# Patient Record
Sex: Female | Born: 1995 | Race: White | Hispanic: No | Marital: Single | Smoking: Never smoker
Health system: Southern US, Community
[De-identification: ages and names within clinical notes are randomized; demographics above are authoritative.]

---
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
## 2015-06-19 ENCOUNTER — Encounter: Payer: Self-pay | Admitting: Emergency Medicine

## 2015-06-19 ENCOUNTER — Emergency Department: Payer: Medicaid Other

## 2015-06-19 ENCOUNTER — Emergency Department
Admission: EM | Admit: 2015-06-19 | Discharge: 2015-06-19 | Disposition: A | Discharge status: Home / Self Care | Payer: Medicaid Other | Attending: Emergency Medicine | Admitting: Emergency Medicine

## 2015-06-19 DIAGNOSIS — R1013 Epigastric pain: Secondary | ICD-10-CM | POA: Diagnosis not present

## 2015-06-19 DIAGNOSIS — R1010 Upper abdominal pain, unspecified: Secondary | ICD-10-CM | POA: Diagnosis present

## 2015-06-19 DIAGNOSIS — R110 Nausea: Secondary | ICD-10-CM | POA: Diagnosis not present

## 2015-06-19 DIAGNOSIS — K8020 Calculus of gallbladder without cholecystitis without obstruction: Secondary | ICD-10-CM | POA: Insufficient documentation

## 2015-06-19 DIAGNOSIS — Z3202 Encounter for pregnancy test, result negative: Secondary | ICD-10-CM | POA: Insufficient documentation

## 2015-06-19 DIAGNOSIS — R109 Unspecified abdominal pain: Secondary | ICD-10-CM | POA: Insufficient documentation

## 2015-06-19 LAB — CBC WITH DIFFERENTIAL/PLATELET
BASOS ABS: 0.2 10*3/uL — AB (ref 0–0.1)
Basophils Relative: 2 %
EOS PCT: 0 %
Eosinophils Absolute: 0 10*3/uL (ref 0–0.7)
HEMATOCRIT: 37.6 % (ref 35.0–47.0)
Hemoglobin: 12.9 g/dL (ref 12.0–16.0)
LYMPHS ABS: 1.1 10*3/uL (ref 1.0–3.6)
LYMPHS PCT: 8 %
MCH: 28.2 pg (ref 26.0–34.0)
MCHC: 34.2 g/dL (ref 32.0–36.0)
MCV: 82.6 fL (ref 80.0–100.0)
MONO ABS: 0.6 10*3/uL (ref 0.2–0.9)
MONOS PCT: 5 %
NEUTROS ABS: 11.1 10*3/uL — AB (ref 1.4–6.5)
Neutrophils Relative %: 85 %
PLATELETS: 179 10*3/uL (ref 150–440)
RBC: 4.56 MIL/uL (ref 3.80–5.20)
RDW: 13.4 % (ref 11.5–14.5)
WBC: 13 10*3/uL — ABNORMAL HIGH (ref 3.6–11.0)

## 2015-06-19 LAB — URINALYSIS COMPLETE WITH MICROSCOPIC (ARMC ONLY)
BILIRUBIN URINE: NEGATIVE
Glucose, UA: NEGATIVE mg/dL
Hgb urine dipstick: NEGATIVE
KETONES UR: NEGATIVE mg/dL
LEUKOCYTES UA: NEGATIVE
NITRITE: NEGATIVE
PROTEIN: 30 mg/dL — AB
SPECIFIC GRAVITY, URINE: 1.029 (ref 1.005–1.030)
pH: 7 (ref 5.0–8.0)

## 2015-06-19 LAB — POCT PREGNANCY, URINE: Preg Test, Ur: NEGATIVE

## 2015-06-19 LAB — COMPREHENSIVE METABOLIC PANEL
ALT: 66 U/L — ABNORMAL HIGH (ref 14–54)
ANION GAP: 6 (ref 5–15)
AST: 101 U/L — ABNORMAL HIGH (ref 15–41)
Albumin: 4.7 g/dL (ref 3.5–5.0)
Alkaline Phosphatase: 77 U/L (ref 38–126)
BUN: 17 mg/dL (ref 6–20)
CALCIUM: 9.3 mg/dL (ref 8.9–10.3)
CHLORIDE: 106 mmol/L (ref 101–111)
CO2: 28 mmol/L (ref 22–32)
Creatinine, Ser: 0.84 mg/dL (ref 0.44–1.00)
GFR calc non Af Amer: >60 mL/min (ref >60)
Glucose, Bld: 136 mg/dL — ABNORMAL HIGH (ref 65–99)
Potassium: 3.8 mmol/L (ref 3.5–5.1)
SODIUM: 140 mmol/L (ref 135–145)
TOTAL PROTEIN: 7.7 g/dL (ref 6.5–8.1)
Total Bilirubin: 0.9 mg/dL (ref 0.3–1.2)

## 2015-06-19 LAB — CBC
HEMATOCRIT: 43.4 % (ref 35.0–47.0)
HEMOGLOBIN: 14.5 g/dL (ref 12.0–16.0)
MCH: 27.7 pg (ref 26.0–34.0)
MCHC: 33.4 g/dL (ref 32.0–36.0)
MCV: 83 fL (ref 80.0–100.0)
Platelets: 193 10*3/uL (ref 150–440)
RBC: 5.23 MIL/uL — ABNORMAL HIGH (ref 3.80–5.20)
RDW: 13 % (ref 11.5–14.5)
WBC: 14.2 10*3/uL — AB (ref 3.6–11.0)

## 2015-06-19 LAB — LIPASE, BLOOD: LIPASE: 34 U/L (ref 11–51)

## 2015-06-19 MED ORDER — SODIUM CHLORIDE 0.9 % IV BOLUS (SEPSIS)
1000.0000 mL | Freq: Once | INTRAVENOUS | Status: AC
Start: 2015-06-19 — End: 2015-06-19
  Administered 2015-06-19: 1000 mL via INTRAVENOUS

## 2015-06-19 MED ORDER — ONDANSETRON 4 MG PO TBDP: 4.0000 mg | ORAL_TABLET | Freq: Four times a day (QID) | ORAL | Status: AC | PRN

## 2015-06-19 MED ORDER — MORPHINE SULFATE (PF) 4 MG/ML IV SOLN
4.0000 mg | Freq: Once | INTRAVENOUS | Status: AC
  Administered 2015-06-19: 4 mg via INTRAVENOUS
  Filled 2015-06-19: qty 1

## 2015-06-19 MED ORDER — OXYCODONE-ACETAMINOPHEN 5-325 MG PO TABS: 1.0000 | ORAL_TABLET | Freq: Four times a day (QID) | ORAL | Status: AC | PRN

## 2015-06-19 MED ORDER — ONDANSETRON HCL 4 MG/2ML IJ SOLN
4.0000 mg | Freq: Once | INTRAMUSCULAR | Status: AC
  Administered 2015-06-19: 4 mg via INTRAVENOUS
  Filled 2015-06-19: qty 2

## 2015-06-19 NOTE — ED Notes (Signed)
Patient ambulatory to triage with steady gait, without difficulty or distress noted; pt reports mid abd pain accomp by nausea x 3 years, worse tonight

## 2015-06-19 NOTE — Consult Note (Signed)
CC: Epigastric pain, now resolved  HPI: Patricia Schultz is a pleasant 19 yo F with a history of gallstones who presents with epigastric pain approx 7 hours ago.  + nausea.  Similar episodes, she reports up to 30, which she has had investigated numerous times at [facility] and which she had been told were due to gallstones.  Usually occur after meals but no specific food.  Currently with minimal pain.  Was about to have surgery once but did not for unknown reasons.  No fevers/chills, night sweats, shortness of breath, cough, chest pain, current abdominal pain, nausea/vomiting, diarrhea/constipation, dysuria/hematuria.  Active Ambulatory Problems    Diagnosis Date Noted  . Abdominal pain   . Gallstones    Resolved Ambulatory Problems    Diagnosis Date Noted  . No Resolved Ambulatory Problems   No Additional Past Medical History   History reviewed. No pertinent past surgical history.     Medication List    TAKE these medications        ondansetron 4 MG disintegrating tablet  Commonly known as:  ZOFRAN ODT  Take 1 tablet (4 mg total) by mouth every 6 (six) hours as needed for nausea or vomiting.     oxyCODONE-acetaminophen 5-325 MG tablet  Commonly known as:  ROXICET  Take 1 tablet by mouth every 6 (six) hours as needed for severe pain.      No Known Allergies   Social History   Social History  . Marital Status: Single    Spouse Name: N/A  . Number of Children: N/A  . Years of Education: N/A   Occupational History  . Not on file.   Social History Main Topics  . Smoking status: Never Smoker   . Smokeless tobacco: Not on file  . Alcohol Use: No  . Drug Use: Not on file  . Sexual Activity: Not on file   Other Topics Concern  . Not on file   Social History Narrative  . No narrative on file   No family history on file.   ROS: Full ROS obtained, pertinent positives and negatives as above  Blood pressure 121/65, pulse 98, temperature 97.4 F (36.3 C), temperature  source Oral, resp. rate 16, height 5\' 3"  (1.6 m), weight 180 lb (81.647 kg), last menstrual period 06/10/2015, SpO2 98 %. GEN: NAD/A&Ox3 FACE: no obvious facial trauma, normal external nose, normal external ears EYES: no scleral icterus, no conjunctivitis HEAD: normocephalic atraumatic CV: RRR, no MRG RESP: moving air well, lungs clear ABD: soft, nontender, nondistended EXT: moving all ext well, strength 5/5 NEURO: cnII-XII grossly intact, sensation intact all 4 ext  Labs: reviewed, significant for  WBC 13.0 AST 101 ALT 66 Lipase 34 Bili 0.9  Imaging: Personally reviewed, significant for gallstones  A/P 19 yo with recurrent epigastric pain and gallstones.  Suspect biliary colic.  No pain to palpation, WBC elevated but afebrile.  No indication for admission or emergent surgery.  Will have patient follow up in office for consideration of outpatient cholecystectomy.

## 2015-06-19 NOTE — Discharge Instructions (Signed)
You were seen in the emergency room for abdominal pain. It is important that you follow up closely with [facility] in the next couple of days.  Please return to the emergency room right away if you are to develop a fever, severe nausea, your pain becomes severe or worsens, you are unable to keep food down, begin vomiting any dark or bloody fluid, you develop any dark or bloody stools, feel dehydrated, or other new concerns or symptoms arise.  No driving tonight or while taking oxycodone.

## 2015-06-19 NOTE — ED Provider Notes (Signed)
[facility] Emergency Department Provider Note REMINDER - THIS NOTE IS NOT A FINAL MEDICAL RECORD UNTIL IT IS SIGNED. UNTIL THEN, THE CONTENT BELOW MAY REFLECT INFORMATION FROM A DOCUMENTATION TEMPLATE, NOT THE ACTUAL PATIENT VISIT. ____________________________________________  Time seen: Approximately 2:33 AM  I have reviewed the triage vital signs and the nursing notes.   HISTORY  Chief Complaint Abdominal Pain    HPI Patricia Schultz is a 19 y.o. female who reports a previous history of wound normal vaginal delivery 2 years ago, also a history of gallstones without previous cholecystectomy.  Patient reports that about 9 PM she developed moderate upper abdominal discomfort, pointing to her epigastrium. She reports that she is nauseated, but has not vomited. She denies fevers or chills. She reports that she's had similar discomfort off and on for about 3 years, she's had previous CAT scans at [facility] and has been told that she has gallstones causing her pain however has never had surgery to remove them.  She denies pregnancy today, she has an Implanon. No pelvic pain, no lower abdominal pain. Pain does occasionally shoot from the upper abdomen into the mid back.  No chest pain or trouble breathing.  Moderate, achy and crampy pain. Fairly constant since 9 PM. History reviewed. No pertinent past medical history.  There are no active problems to display for this patient.   History reviewed. No pertinent past surgical history.  Current Outpatient Rx  Name  Route  Sig  Dispense  Refill  . ondansetron (ZOFRAN ODT) 4 MG disintegrating tablet   Oral   Take 1 tablet (4 mg total) by mouth every 6 (six) hours as needed for nausea or vomiting.   20 tablet   0   . oxyCODONE-acetaminophen (ROXICET) 5-325 MG tablet   Oral   Take 1 tablet by mouth every 6 (six) hours as needed for severe pain.   6 tablet   0     Allergies Review of patient's  allergies indicates no known allergies.  No family history on file.  Social History Social History  Substance Use Topics  . Smoking status: Never Smoker   . Smokeless tobacco: None  . Alcohol Use: No    Review of Systems Constitutional: No fever/chills Eyes: No visual changes. ENT: No sore throat. Cardiovascular: Denies chest pain. Respiratory: Denies shortness of breath. Gastrointestinal: no vomiting.  No diarrhea.  No constipation. Genitourinary: Negative for dysuria. Musculoskeletal: Negative for back pain. No vaginal discomfort. No pelvic pain. Denies pregnancy. Skin: Negative for rash. Neurological: Negative for headaches, focal weakness or numbness.  10-point ROS otherwise negative.  ____________________________________________   PHYSICAL EXAM:  VITAL SIGNS: ED Triage Vitals  Enc Vitals Group     BP 06/19/15 0109 126/82 mmHg     Pulse Rate 06/19/15 0109 94     Resp 06/19/15 0109 20     Temp 06/19/15 0109 97.4 F (36.3 C)     Temp Source 06/19/15 0109 Oral     SpO2 06/19/15 0109 98 %     Weight 06/19/15 0109 180 lb (81.647 kg)     Height 06/19/15 0109 5\' 3"  (1.6 m)     Head Cir --      Peak Flow --      Pain Score 06/19/15 0124 6     Pain Loc --      Pain Edu? --      Excl. in GC? --    Constitutional: Alert and oriented. Well appearing and in  no acute distress. Eyes: Conjunctivae are normal. PERRL. EOMI. Head: Atraumatic. Nose: No congestion/rhinnorhea. Mouth/Throat: Mucous membranes are moist.  Oropharynx non-erythematous. Neck: No stridor.   Cardiovascular: Normal rate, regular rhythm. Grossly normal heart sounds.  Good peripheral circulation. Respiratory: Normal respiratory effort.  No retractions. Lungs CTAB. Gastrointestinal: Soft and nontender except for mild to moderate tenderness in the right upper quadrant and epigastrium without rebound or guarding. No distention. No abdominal bruits. No CVA tenderness. No tenderness at McBurney's point.  Negative psoas. No tenderness across the suprapubic region or pelvis. Musculoskeletal: No lower extremity tenderness nor edema.  No joint effusions. Neurologic:  Normal speech and language. No gross focal neurologic deficits are appreciated. Skin:  Skin is warm, dry and intact. No rash noted. Psychiatric: Mood and affect are normal. Speech and behavior are normal.  ____________________________________________   LABS (all labs ordered are listed, but only abnormal results are displayed)  Labs Reviewed  COMPREHENSIVE METABOLIC PANEL - Abnormal; Notable for the following:    Glucose, Bld 136 (*)    AST 101 (*)    ALT 66 (*)    All other components within normal limits  CBC - Abnormal; Notable for the following:    WBC 14.2 (*)    RBC 5.23 (*)    All other components within normal limits  URINALYSIS COMPLETEWITH MICROSCOPIC ([facility] ONLY) - Abnormal; Notable for the following:    Color, Urine YELLOW (*)    APPearance HAZY (*)    Protein, ur 30 (*)    Bacteria, UA RARE (*)    Squamous Epithelial / LPF 6-30 (*)    All other components within normal limits  CBC WITH DIFFERENTIAL/PLATELET - Abnormal; Notable for the following:    WBC 13.0 (*)    Neutro Abs 11.1 (*)    Basophils Absolute 0.2 (*)    All other components within normal limits  LIPASE, BLOOD  POCT PREGNANCY, URINE  POC URINE PREG, ED   ____________________________________________  EKG   ____________________________________________  RADIOLOGY  US Abdomen Limited RUQ (Final result) Result time: 06/19/15 03:32:46   Final result by Rad Results In Interface (06/19/15 03:32:46)   Narrative:   CLINICAL DATA: Abdominal pain. History of gallstones.  EXAM: US ABDOMEN LIMITED - RIGHT UPPER QUADRANT  COMPARISON: 04/12/2015  FINDINGS: Gallbladder:  Multiple gallstones demonstrated in the gallbladder appear mobile. No gallbladder wall thickening. Murphy's sign is negative.  Common bile duct:  Diameter: 3.9 mm,  normal. Pancreatic head region is obscured by bowel gas today and is not seen.  Liver:  No focal lesion identified. Within normal limits in parenchymal echogenicity.  IMPRESSION: Cholelithiasis with multiple stones in the gallbladder. Murphy's sign is negative.     I discussed with the patient the risks and benefits of abdominal CT scan. The present time there is no clear indication that the patient requires CT, the patient does have an abdominal complaint but exam does not suggest acute surgical abdomen and my suspicion for intra-abdominal infection including appendicitis, cholecystitis, aaa, dissection, ischemia, perforation, pancreatitis, diverticulitis or other acute major intra-abdominal process is quite low. After discussing the risks and benefits including benefits of additional evaluation for diagnoses, ruling out infection/perforation/aaa/etc, but also discussing the risks including low, "well less than 1%," but not 0 risk of inducing cancers due to radiation and potential risks of contrast the patient indicated via our shared medical decision-making that she would not do a CAT scan. Rather if the patient does have worsening symptoms, develops a high fever, develops pain  or persistent discomfort in the right upper quadrant or right lower quadrant, or other new concerns arise they will come back to emergency room right away. As the patient's clinician I think this is a very reasonable decision having discussed general risks and benefits of CT, and my clinical suspicion that CT would be of benefit at this time is very low.  ____________________________________________   PROCEDURES  Procedure(s) performed: None  Critical Care performed: No  ____________________________________________   INITIAL IMPRESSION / ASSESSMENT AND PLAN / ED COURSE  Pertinent labs & imaging results that were available during my care of the patient were reviewed by me and considered in my medical decision  making (see chart for details).  Patient presents with recurrent episode of reportedly chronic and episodic upper abdominal pain. Potentially diagnosed as gallstone related in the past, versus other etiology via [facility]  She is awake and alert and no distress, but she does have focal tenderness in epigastrium right upper quadrant. No signs or symptoms to suggest appendicitis by exam or clinical history. No pelvic pain, no lower abdominal pain. No associated vaginal discharge or symptoms.  At this point, given the patient's history and location of pain we'll proceed with right upper quadrant ultrasound to further evaluate for possible biliary process, or other acute right upper quadrant etiology. We'll reevaluate after ultrasound.  ----------------------------------------- 3:47 AM on 06/19/2015 -----------------------------------------  Patient reports her pain is only mild, but slightly persistent at this time. Continues to have mild epigastric pain on reexam. No rebound or guarding. Overall appears improved, fully alert in no distress. She does report improvement in pain. Based on her leukocytosis, presentation with right upper quadrant discomfort, and gallstones noted ultrasound I have placed a consult with Dr. Juliann Pulse of surgery who will see the patient in the ER for further advice and evaluation.  ----------------------------------------- 4:31 AM on 06/19/2015 -----------------------------------------  Patient reports pain free and all symptoms resolved. Has been seen by Dr. Juliann Pulse who advises outpatient evaluation with general surgery. We'll discharge the patient home, discussed careful use of oxycodone and careful abdominal pain return precautions with the patient who is agreeable. Friend will be driving her home.  I will prescribe the patient a narcotic pain medicine due to their condition which I anticipate will cause at least moderate pain short term. I discussed with  the patient safe use of narcotic pain medicines, and that they are not to drive, work in dangerous areas, or ever take more than prescribed (no more than 1 pill every 6 hours). We discussed that this is the type of medication that "Criss Alvine" may have overdosed on and the risks of this type of medicine. Patient is very agreeable to only use as prescribed and to never use more than prescribed.  ____________________________________________   FINAL CLINICAL IMPRESSION(S) / ED DIAGNOSES  Final diagnoses:  Abdominal pain   biliary colic    Sharyn Creamer, MD 06/19/15 [phone]

## 2016-03-18 DIAGNOSIS — K8051 Calculus of bile duct without cholangitis or cholecystitis with obstruction: Secondary | ICD-10-CM | POA: Diagnosis not present

## 2016-03-18 DIAGNOSIS — K8050 Calculus of bile duct without cholangitis or cholecystitis without obstruction: Secondary | ICD-10-CM

## 2016-03-19 DIAGNOSIS — K8051 Calculus of bile duct without cholangitis or cholecystitis with obstruction: Secondary | ICD-10-CM | POA: Diagnosis not present

## 2016-03-19 DIAGNOSIS — K8050 Calculus of bile duct without cholangitis or cholecystitis without obstruction: Secondary | ICD-10-CM | POA: Diagnosis not present

## 2016-03-20 DIAGNOSIS — K8051 Calculus of bile duct without cholangitis or cholecystitis with obstruction: Secondary | ICD-10-CM | POA: Diagnosis not present

## 2016-03-20 DIAGNOSIS — K8050 Calculus of bile duct without cholangitis or cholecystitis without obstruction: Secondary | ICD-10-CM | POA: Diagnosis not present

## 2017-04-24 IMAGING — US US ABDOMEN LIMITED
1 series · 14 of 25 positions shown · non-contrast
Comparison: 04/12/2015

CLINICAL DATA: Abdominal pain.  History of gallstones.

EXAM:
US ABDOMEN LIMITED - RIGHT UPPER QUADRANT

[Series 1: us abdomen limited · 0.17mm/px · 14 of 45 slices shown]
[im 1/45]
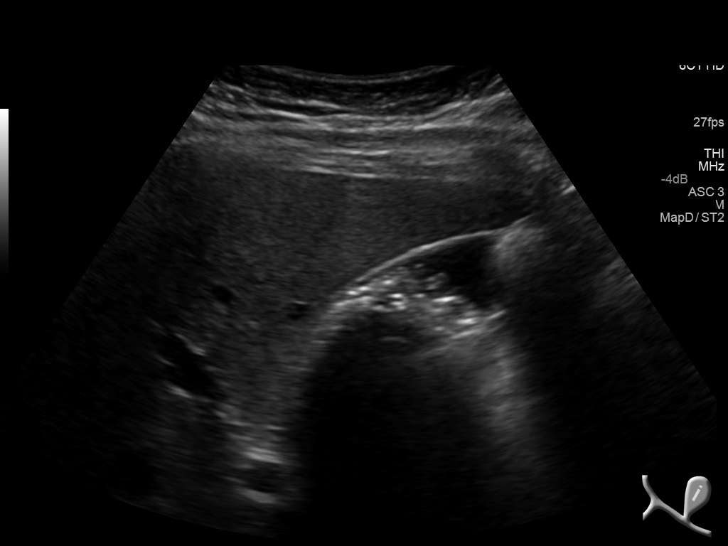
[im 4/45]
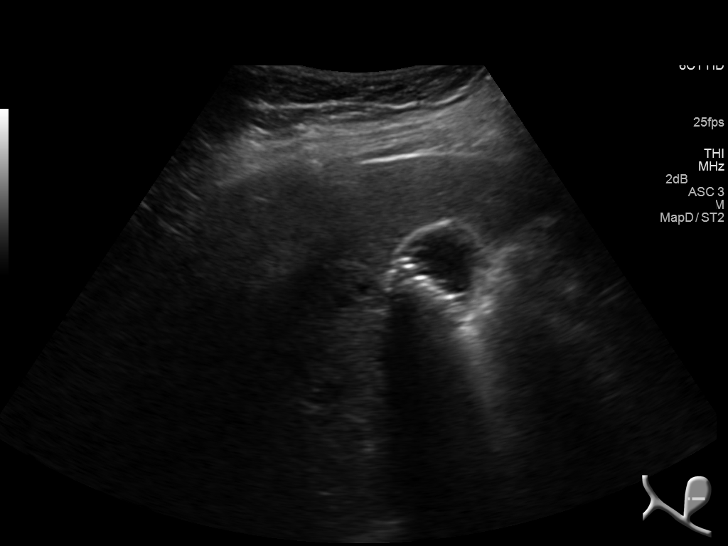
[im 8/45]
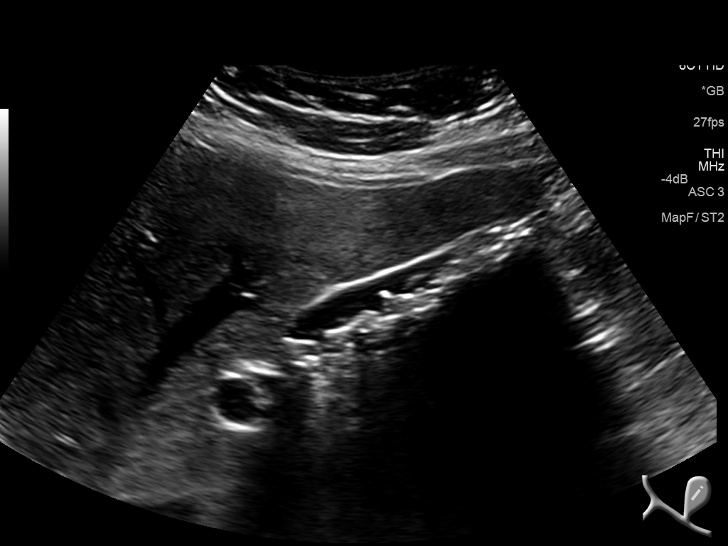
[im 12/45]
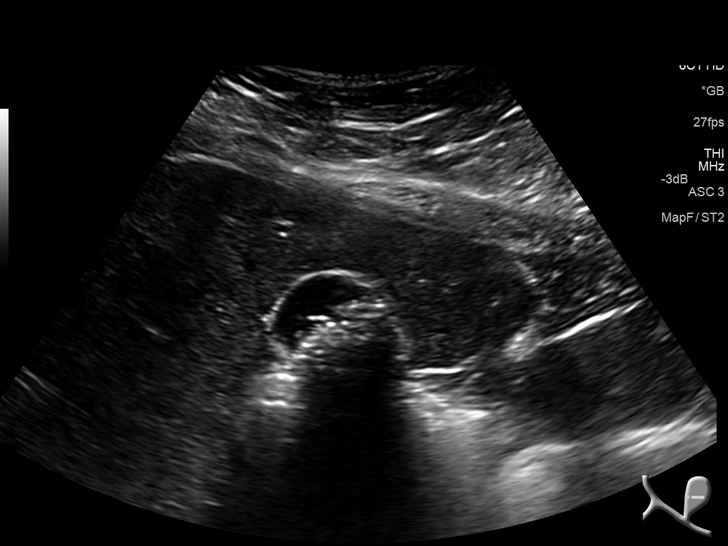
[im 15/45]
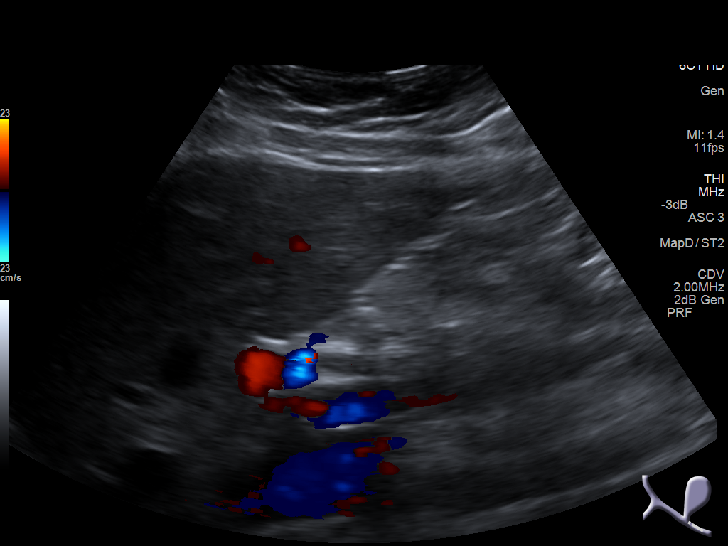
[im 17/45]
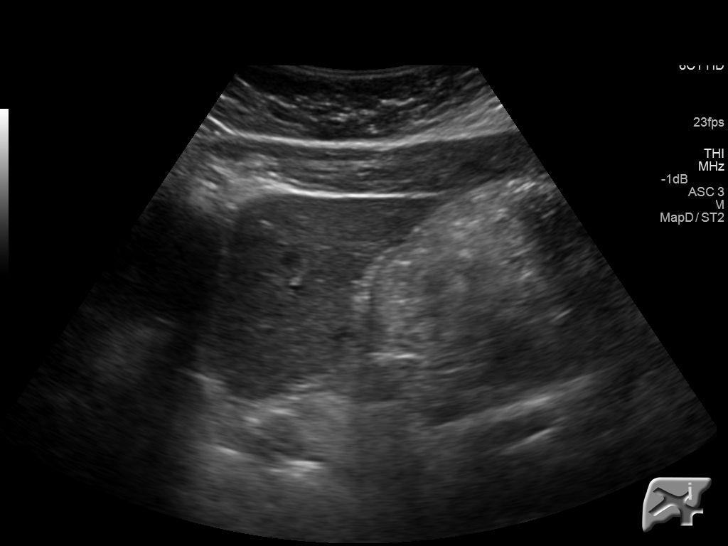
[im 21/45]
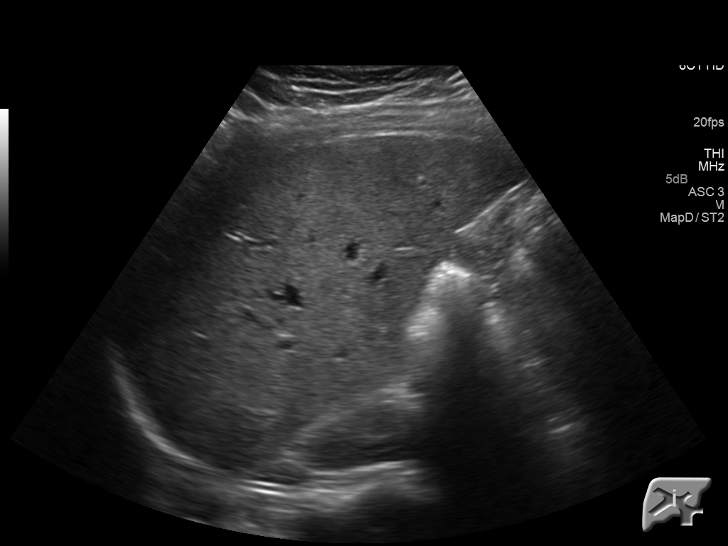
[im 24/45]
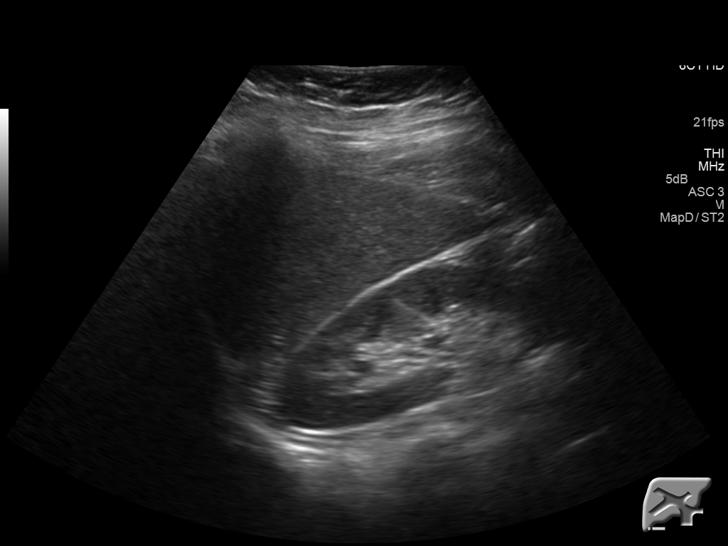
[im 28/45]
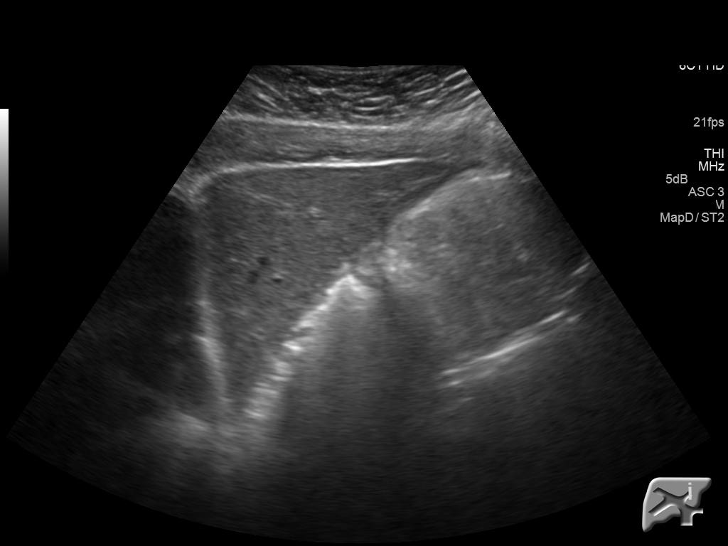
[im 30/45]
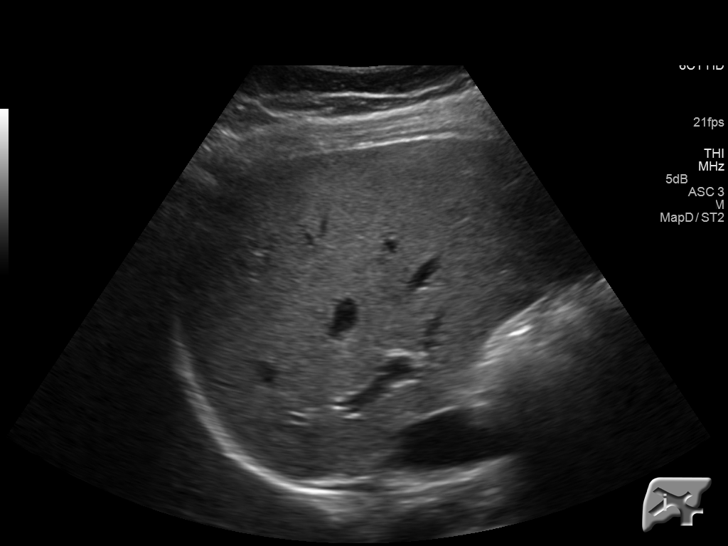
[im 34/45]
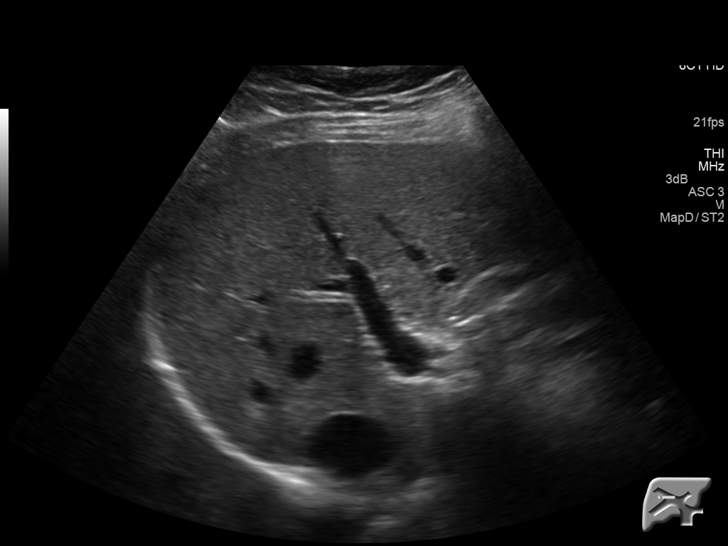
[im 37/45]
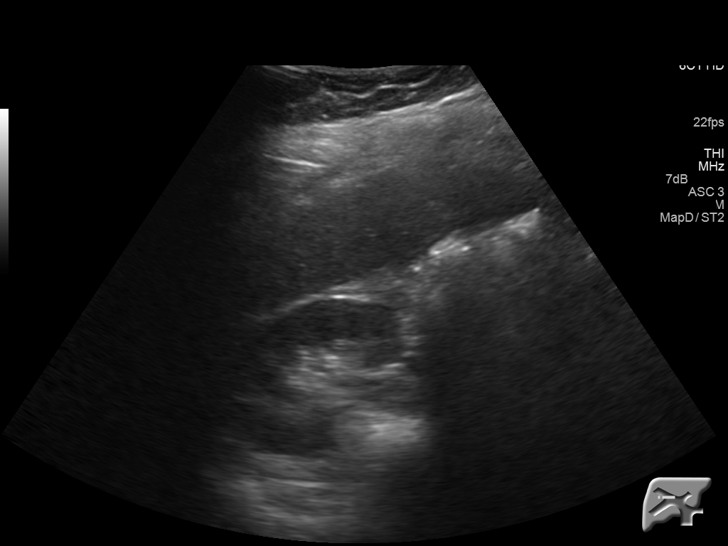
[im 41/45]
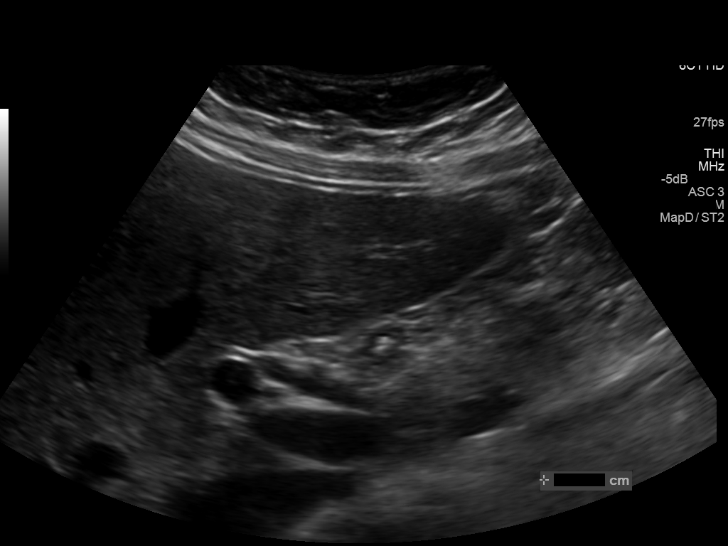
[im 45/45]
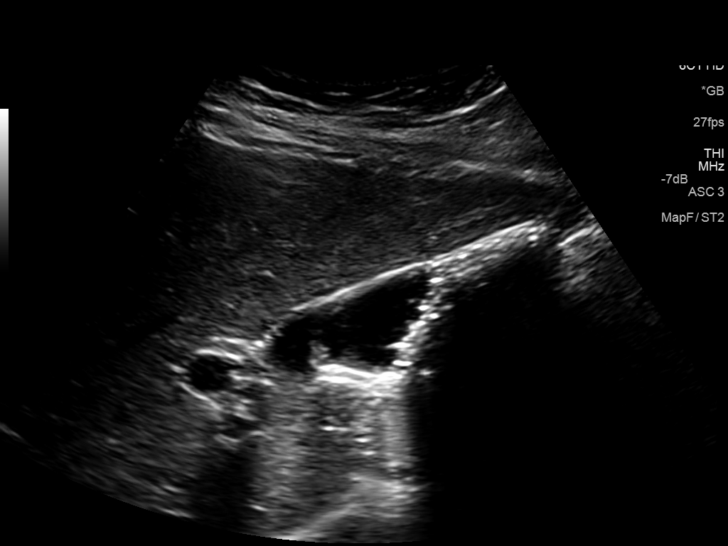

[14 of 25 positions shown; findings below may reference images not displayed]

FINDINGS: Gallbladder:

Multiple gallstones demonstrated in the gallbladder appear mobile.
No gallbladder wall thickening. Murphy's sign is negative.

Common bile duct:

Diameter: 3.9 mm, normal. Pancreatic head region is obscured by
bowel gas today and is not seen.

Liver:

No focal lesion identified. Within normal limits in parenchymal
echogenicity.
IMPRESSION: Cholelithiasis with multiple stones in the gallbladder. Murphy's
sign is negative.
